# Patient Record
Sex: Male | Born: 1985 | Race: White | Hispanic: Yes | Marital: Single | Smoking: Current every day smoker
Health system: Southern US, Community
[De-identification: ages and names within clinical notes are randomized; demographics above are authoritative.]

## PROBLEM LIST (undated history)

## (undated) DIAGNOSIS — E119 Type 2 diabetes mellitus without complications: Secondary | ICD-10-CM

## (undated) DIAGNOSIS — F1010 Alcohol abuse, uncomplicated: Secondary | ICD-10-CM

---

## 2006-08-12 ENCOUNTER — Emergency Department (HOSPITAL_COMMUNITY): Admission: EM | Admit: 2006-08-12 | Discharge: 2006-08-12 | Payer: Self-pay | Admitting: Emergency Medicine

## 2018-11-02 ENCOUNTER — Emergency Department (HOSPITAL_COMMUNITY)
Admission: EM | Admit: 2018-11-02 | Discharge: 2018-11-02 | Disposition: A | Discharge status: Home / Self Care | Payer: Self-pay | Attending: Emergency Medicine | Admitting: Emergency Medicine

## 2018-11-02 ENCOUNTER — Encounter (HOSPITAL_COMMUNITY): Payer: Self-pay | Admitting: Emergency Medicine

## 2018-11-02 ENCOUNTER — Emergency Department (HOSPITAL_COMMUNITY): Payer: Self-pay

## 2018-11-02 ENCOUNTER — Other Ambulatory Visit: Payer: Self-pay

## 2018-11-02 DIAGNOSIS — R945 Abnormal results of liver function studies: Secondary | ICD-10-CM | POA: Insufficient documentation

## 2018-11-02 DIAGNOSIS — F10129 Alcohol abuse with intoxication, unspecified: Secondary | ICD-10-CM | POA: Insufficient documentation

## 2018-11-02 DIAGNOSIS — E119 Type 2 diabetes mellitus without complications: Secondary | ICD-10-CM | POA: Insufficient documentation

## 2018-11-02 DIAGNOSIS — R197 Diarrhea, unspecified: Secondary | ICD-10-CM | POA: Insufficient documentation

## 2018-11-02 DIAGNOSIS — R51 Headache: Secondary | ICD-10-CM | POA: Insufficient documentation

## 2018-11-02 DIAGNOSIS — R7989 Other specified abnormal findings of blood chemistry: Secondary | ICD-10-CM

## 2018-11-02 DIAGNOSIS — R634 Abnormal weight loss: Secondary | ICD-10-CM | POA: Insufficient documentation

## 2018-11-02 DIAGNOSIS — F1010 Alcohol abuse, uncomplicated: Secondary | ICD-10-CM

## 2018-11-02 DIAGNOSIS — Y907 Blood alcohol level of 200-239 mg/100 ml: Secondary | ICD-10-CM | POA: Insufficient documentation

## 2018-11-02 HISTORY — DX: Alcohol abuse, uncomplicated: F10.10

## 2018-11-02 LAB — CBC WITH DIFFERENTIAL/PLATELET
ABS IMMATURE GRANULOCYTES: 0.02 10*3/uL (ref 0.00–0.07)
BASOS ABS: 0.1 10*3/uL (ref 0.0–0.1)
BASOS PCT: 1 %
Eosinophils Absolute: 0 10*3/uL (ref 0.0–0.5)
Eosinophils Relative: 0 %
HCT: 47.2 % (ref 39.0–52.0)
Hemoglobin: 16 g/dL (ref 13.0–17.0)
Immature Granulocytes: 0 %
Lymphocytes Relative: 35 %
Lymphs Abs: 2.3 10*3/uL (ref 0.7–4.0)
MCH: 31.9 pg (ref 26.0–34.0)
MCHC: 33.9 g/dL (ref 30.0–36.0)
MCV: 94.2 fL (ref 80.0–100.0)
MONO ABS: 0.3 10*3/uL (ref 0.1–1.0)
Monocytes Relative: 4 %
NEUTROS ABS: 3.8 10*3/uL (ref 1.7–7.7)
NEUTROS PCT: 60 %
NRBC: 0 % (ref 0.0–0.2)
PLATELETS: 231 10*3/uL (ref 150–400)
RBC: 5.01 MIL/uL (ref 4.22–5.81)
RDW: 11.6 % (ref 11.5–15.5)
WBC: 6.5 10*3/uL (ref 4.0–10.5)

## 2018-11-02 LAB — COMPREHENSIVE METABOLIC PANEL
ALT: 199 U/L — AB (ref 0–44)
AST: 174 U/L — AB (ref 15–41)
Albumin: 4.3 g/dL (ref 3.5–5.0)
Alkaline Phosphatase: 109 U/L (ref 38–126)
Anion gap: 14 (ref 5–15)
CHLORIDE: 104 mmol/L (ref 98–111)
CO2: 19 mmol/L — ABNORMAL LOW (ref 22–32)
CREATININE: 0.5 mg/dL — AB (ref 0.61–1.24)
Calcium: 9.3 mg/dL (ref 8.9–10.3)
GFR calc Af Amer: >60 mL/min (ref >60)
GFR calc non Af Amer: >60 mL/min (ref >60)
GLUCOSE: 265 mg/dL — AB (ref 70–99)
POTASSIUM: 3.6 mmol/L (ref 3.5–5.1)
SODIUM: 137 mmol/L (ref 135–145)
Total Bilirubin: 0.5 mg/dL (ref 0.3–1.2)
Total Protein: 8.2 g/dL — ABNORMAL HIGH (ref 6.5–8.1)

## 2018-11-02 LAB — URINALYSIS, ROUTINE W REFLEX MICROSCOPIC
BACTERIA UA: NONE SEEN
Bilirubin Urine: NEGATIVE
Glucose, UA: >=500 mg/dL — AB
KETONES UR: 5 mg/dL — AB
LEUKOCYTE UA: NEGATIVE
NITRITE: NEGATIVE
PROTEIN: NEGATIVE mg/dL
Specific Gravity, Urine: 1.01 (ref 1.005–1.030)
pH: 6 (ref 5.0–8.0)

## 2018-11-02 LAB — RAPID URINE DRUG SCREEN, HOSP PERFORMED
AMPHETAMINES: NOT DETECTED
BARBITURATES: NOT DETECTED
BENZODIAZEPINES: NOT DETECTED
COCAINE: POSITIVE — AB
OPIATES: NOT DETECTED
Tetrahydrocannabinol: NOT DETECTED

## 2018-11-02 LAB — ACETAMINOPHEN LEVEL: Acetaminophen (Tylenol), Serum: <10 ug/mL — ABNORMAL LOW (ref 10–30)

## 2018-11-02 LAB — CBG MONITORING, ED: GLUCOSE-CAPILLARY: 265 mg/dL — AB (ref 70–99)

## 2018-11-02 LAB — ETHANOL: ALCOHOL ETHYL (B): 221 mg/dL — AB (ref <10)

## 2018-11-02 LAB — LIPASE, BLOOD: Lipase: 31 U/L (ref 11–51)

## 2018-11-02 LAB — SALICYLATE LEVEL

## 2018-11-02 MED ORDER — METFORMIN HCL 500 MG PO TABS
500.0000 mg | ORAL_TABLET | Freq: Once | ORAL | Status: AC
  Administered 2018-11-02: 500 mg via ORAL
  Filled 2018-11-02: qty 1

## 2018-11-02 MED ORDER — METFORMIN HCL 500 MG PO TABS: 500.0000 mg | ORAL_TABLET | Freq: Two times a day (BID) | ORAL | 0 refills | Status: AC

## 2018-11-02 MED ORDER — SODIUM CHLORIDE 0.9 % IV BOLUS
1000.0000 mL | Freq: Once | INTRAVENOUS | Status: AC
  Administered 2018-11-02: 1000 mL via INTRAVENOUS

## 2018-11-02 NOTE — ED Notes (Signed)
Patient transported to X-ray 

## 2018-11-02 NOTE — ED Triage Notes (Signed)
Using the interpreter patient reports headache and feeling thirsty x 2 days. Patient admits drinking 2 beers aprox 6-8 hours ago. repeatedly asking patient if he drinks alcohol daily, but will not answer.

## 2018-11-02 NOTE — ED Notes (Signed)
Patient informed MD that he drank 12 beers, not 2 throughout the night.

## 2018-11-02 NOTE — Discharge Instructions (Signed)
Llame a los dos nmeros de telfono enumerados para programar una cita con el mdico de atencin primaria que puede verlo primero. Regrese al servicio de urgencias con cualquier sntoma nuevo o que empeore. Su consumo de alcohol est causando dao a su hgado. Debe dejar de beber y que su mdico repita sus anlisis de hgado una vez que deje de [facility].

## 2018-11-02 NOTE — ED Provider Notes (Signed)
Emergency Department Provider Note   I have reviewed the triage vital signs and the nursing notes.   HISTORY  Chief Complaint Headache; Dry Mouth; and Weight Loss  HPI, ROS, and exam performed with video Spanish interpreter  HPI Jacob Duarte is a 33 y.o. male with no known past medical history presents to the emergency department with increased thirst, dry mouth, unintentional weight loss, fatigue, diarrhea.  Patient states that he has been drinking almost daily for the past 3 months to deal with his dry mouth.  He states when he drinks water he feels like he needs to drink more and more but if he drinks beer this seems to quench his thirst.  He estimates drinking approximately 12 beers last night but states that he typically drinks 3 daily.  Denies any history of alcohol abuse.  He denies using other drugs.  He denies any depression or intention to harm himself.  He denies taking any pills.  He does not take prescription medications.  He has not had vomiting or chest pain.  Patient came in today because he felt like his symptoms are getting worse and not better.  He does not have a primary care physician.    Past Medical History:  Diagnosis Date  . Alcohol abuse     There are no active problems to display for this patient.  Allergies Patient has no known allergies.  No family history on file.  Social History Social History   Tobacco Use  . Smoking status: Never Smoker  . Smokeless tobacco: Never Used  Substance Use Topics  . Alcohol use: Yes  . Drug use: Not Currently    Review of Systems  Constitutional: No fever/chills. Positive fatigue.  Eyes: No visual changes. ENT: No sore throat. Positive dry mouth and polydipsia.  Cardiovascular: Denies chest pain. Respiratory: Denies shortness of breath. Gastrointestinal: No abdominal pain.  No nausea, no vomiting. Positive diarrhea.  No constipation. Genitourinary: Negative for dysuria. Musculoskeletal: Negative  for back pain. Skin: Negative for rash. Neurological: Negative for headaches, focal weakness or numbness.  10-point ROS otherwise negative.  ____________________________________________   PHYSICAL EXAM:  VITAL SIGNS: ED Triage Vitals  Enc Vitals Group     BP 11/02/18 0732 136/89     Pulse Rate 11/02/18 0732 (!) 109     Resp 11/02/18 0732 18     Temp 11/02/18 0741 98.5 F (36.9 C)     Temp Source 11/02/18 0741 Oral     SpO2 11/02/18 0732 97 %   Constitutional: Alert and oriented. Well appearing and in no acute distress. Eyes: Conjunctivae are normal.  Head: Atraumatic. Nose: No congestion/rhinnorhea. Mouth/Throat: Mucous membranes are dry. No ulcerations or other lesions.  Neck: No stridor.   Cardiovascular: Tachycardia. Good peripheral circulation. Grossly normal heart sounds.   Respiratory: Normal respiratory effort.  No retractions. Lungs CTAB. Gastrointestinal: Soft and nontender. No distention.  Musculoskeletal: No lower extremity tenderness nor edema. No gross deformities of extremities. Neurologic:  Normal speech and language. No gross focal neurologic deficits are appreciated.  Skin:  Skin is warm, dry and intact. No rash noted.   ____________________________________________   LABS (all labs ordered are listed, but only abnormal results are displayed)  Labs Reviewed  COMPREHENSIVE METABOLIC PANEL - Abnormal; Notable for the following components:      Result Value   CO2 19 (*)    Glucose, Bld 265 (*)    BUN <5 (*)    Creatinine, Ser 0.50 (*)  Total Protein 8.2 (*)    AST 174 (*)    ALT 199 (*)    All other components within normal limits  ETHANOL - Abnormal; Notable for the following components:   Alcohol, Ethyl (B) 221 (*)    All other components within normal limits  ACETAMINOPHEN LEVEL - Abnormal; Notable for the following components:   Acetaminophen (Tylenol), Serum <10 (*)    All other components within normal limits  CBG MONITORING, ED -  Abnormal; Notable for the following components:   Glucose-Capillary 265 (*)    All other components within normal limits  LIPASE, BLOOD  SALICYLATE LEVEL  CBC WITH DIFFERENTIAL/PLATELET  URINALYSIS, ROUTINE W REFLEX MICROSCOPIC  RAPID URINE DRUG SCREEN, HOSP PERFORMED   ____________________________________________  EKG   EKG Interpretation  Date/Time:  Saturday November 02 2018 08:05:57 EST Ventricular Rate:  103 PR Interval:    QRS Duration: 96 QT Interval:  341 QTC Calculation: 447 R Axis:   6 Text Interpretation:  Sinus tachycardia No STEMI.  Confirmed by Alona Bene [phone]) on 11/02/2018 8:14:02 AM       ____________________________________________  RADIOLOGY  Dg Chest 2 View  Result Date: 11/02/2018 CLINICAL DATA:  Headache and dehydration.  No chest complaints. EXAM: CHEST - 2 VIEW COMPARISON:  Chest x-ray dated August 12, 2006. FINDINGS: The heart size and mediastinal contours are within normal limits. Both lungs are clear. The visualized skeletal structures are unremarkable. IMPRESSION: No active cardiopulmonary disease. Electronically Signed   By: Obie Dredge M.D.   On: 11/02/2018 08:43    ____________________________________________   PROCEDURES  Procedure(s) performed:   Procedures  None  ____________________________________________   INITIAL IMPRESSION / ASSESSMENT AND PLAN / ED COURSE  Pertinent labs & imaging results that were available during my care of the patient were reviewed by me and considered in my medical decision making (see chart for details).  Patient presents to the emergency department for evaluation of dry mouth, polydipsia, diarrhea, generalized weakness.  He drinks alcohol daily but states he just started doing that over the last 3 months because of the polydipsia.  Initial triage note reports drinking and taking an unknown amount of pills.  Patient denies this and family have no concerns regarding this.  Unclear how this became  the patient's chief complaint but I do plan on obtaining screening blood work for possible new onset diabetes.  Given the initial arrival complaint I will also obtain a Tylenol, salicylate, UDS.   Lab work reviewed which shows new onset diabetes.  No DKA.  Patient's symptoms improved with IV fluids.  Starting metformin here and discharged with a prescription for the next 30 days.  I discussed diabetes with the patient including diet modifications.  Also discussed that his alcohol abuse is likely affecting his liver in a negative way.  I discussed his abnormal liver labs.  No abdominal pain, tenderness, bilirubin elevation to require ultrasound or other abdominal imaging at this time.  Provided multiple contacts for PCPs in the area and discussed ED return precautions.  All this was done with the video Spanish interpreter and family at bedside.  Patient does have elevated ethanol level here but family is sober and will drive him home.  ____________________________________________  FINAL CLINICAL IMPRESSION(S) / ED DIAGNOSES  Final diagnoses:  Diabetes mellitus, new onset (HCC)  Alcohol abuse  Elevated LFTs     MEDICATIONS GIVEN DURING THIS VISIT:  Medications  metFORMIN (GLUCOPHAGE) tablet 500 mg (has no administration in time range)  sodium  chloride 0.9 % bolus 1,000 mL (0 mLs Intravenous Stopped 11/02/18 0930)     NEW OUTPATIENT MEDICATIONS STARTED DURING THIS VISIT:  New Prescriptions   METFORMIN (GLUCOPHAGE) 500 MG TABLET    Take 1 tablet (500 mg total) by mouth 2 (two) times daily with a meal for 30 days.    Note:  This document was prepared using Dragon voice recognition software and may include unintentional dictation errors.  Alona Bene, MD Emergency Medicine    Tiwanda Threats, Arlyss Repress, MD 11/02/18 1009

## 2018-12-19 ENCOUNTER — Emergency Department (HOSPITAL_COMMUNITY): Payer: Self-pay

## 2018-12-19 ENCOUNTER — Emergency Department (HOSPITAL_COMMUNITY)
Admission: EM | Admit: 2018-12-19 | Discharge: 2018-12-19 | Disposition: A | Discharge status: Home / Self Care | Payer: Self-pay | Attending: Emergency Medicine | Admitting: Emergency Medicine

## 2018-12-19 ENCOUNTER — Encounter (HOSPITAL_COMMUNITY): Payer: Self-pay | Admitting: Emergency Medicine

## 2018-12-19 ENCOUNTER — Other Ambulatory Visit: Payer: Self-pay

## 2018-12-19 DIAGNOSIS — F17210 Nicotine dependence, cigarettes, uncomplicated: Secondary | ICD-10-CM | POA: Insufficient documentation

## 2018-12-19 DIAGNOSIS — W19XXXA Unspecified fall, initial encounter: Secondary | ICD-10-CM

## 2018-12-19 DIAGNOSIS — S41112A Laceration without foreign body of left upper arm, initial encounter: Secondary | ICD-10-CM | POA: Insufficient documentation

## 2018-12-19 DIAGNOSIS — S51812A Laceration without foreign body of left forearm, initial encounter: Secondary | ICD-10-CM | POA: Insufficient documentation

## 2018-12-19 DIAGNOSIS — W16112A Fall into natural body of water striking water surface causing other injury, initial encounter: Secondary | ICD-10-CM | POA: Insufficient documentation

## 2018-12-19 DIAGNOSIS — Z79899 Other long term (current) drug therapy: Secondary | ICD-10-CM | POA: Insufficient documentation

## 2018-12-19 DIAGNOSIS — Z23 Encounter for immunization: Secondary | ICD-10-CM | POA: Insufficient documentation

## 2018-12-19 DIAGNOSIS — F1010 Alcohol abuse, uncomplicated: Secondary | ICD-10-CM | POA: Insufficient documentation

## 2018-12-19 DIAGNOSIS — W540XXA Bitten by dog, initial encounter: Secondary | ICD-10-CM | POA: Insufficient documentation

## 2018-12-19 DIAGNOSIS — Y9389 Activity, other specified: Secondary | ICD-10-CM | POA: Insufficient documentation

## 2018-12-19 DIAGNOSIS — Y929 Unspecified place or not applicable: Secondary | ICD-10-CM | POA: Insufficient documentation

## 2018-12-19 DIAGNOSIS — Y998 Other external cause status: Secondary | ICD-10-CM | POA: Insufficient documentation

## 2018-12-19 DIAGNOSIS — E119 Type 2 diabetes mellitus without complications: Secondary | ICD-10-CM | POA: Insufficient documentation

## 2018-12-19 HISTORY — DX: Type 2 diabetes mellitus without complications: E11.9

## 2018-12-19 LAB — RAPID URINE DRUG SCREEN, HOSP PERFORMED
Amphetamines: NOT DETECTED
Barbiturates: NOT DETECTED
Benzodiazepines: NOT DETECTED
Cocaine: NOT DETECTED
Opiates: NOT DETECTED
Tetrahydrocannabinol: NOT DETECTED

## 2018-12-19 LAB — CBG MONITORING, ED: Glucose-Capillary: 142 mg/dL — ABNORMAL HIGH (ref 70–99)

## 2018-12-19 LAB — RAPID HIV SCREEN (HIV 1/2 AB+AG)
HIV 1/2 Antibodies: NONREACTIVE
HIV-1 P24 Antigen - HIV24: NONREACTIVE

## 2018-12-19 MED ORDER — TETANUS-DIPHTH-ACELL PERTUSSIS 5-2.5-18.5 LF-MCG/0.5 IM SUSP
0.5000 mL | Freq: Once | INTRAMUSCULAR | Status: AC
  Administered 2018-12-19: 0.5 mL via INTRAMUSCULAR
  Filled 2018-12-19: qty 0.5

## 2018-12-19 MED ORDER — SODIUM CHLORIDE 0.9 % IV SOLN
3.0000 g | Freq: Once | INTRAVENOUS | Status: AC
  Administered 2018-12-19: 3 g via INTRAVENOUS
  Filled 2018-12-19: qty 3

## 2018-12-19 MED ORDER — SODIUM CHLORIDE 0.9 % IV BOLUS
1000.0000 mL | Freq: Once | INTRAVENOUS | Status: AC
  Administered 2018-12-19: 1000 mL via INTRAVENOUS

## 2018-12-19 MED ORDER — AMOXICILLIN-POT CLAVULANATE 875-125 MG PO TABS: 1.0000 | ORAL_TABLET | Freq: Two times a day (BID) | ORAL | 0 refills | Status: AC

## 2018-12-19 MED ORDER — LIDOCAINE-EPINEPHRINE (PF) 2 %-1:200000 IJ SOLN
20.0000 mL | Freq: Once | INTRAMUSCULAR | Status: AC
  Administered 2018-12-19: 20 mL via INTRADERMAL
  Filled 2018-12-19: qty 20

## 2018-12-19 NOTE — ED Notes (Signed)
Patient transported to CT 

## 2018-12-19 NOTE — ED Notes (Signed)
Provider at bedside
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
## 2018-12-19 NOTE — ED Notes (Signed)
Pt states his birthday is 06-03-1986 not November 03, 1985.

## 2018-12-19 NOTE — ED Triage Notes (Signed)
[facility] transported pt to [facility] ED and reports the following:  --Pt speaks spanish --Dog bites Left upper arm - 2-6 bites anterior and posterior to adipose tissue. still bleeding Left upper chest - bites Right calf - puncture, bites  --ETOH on board, amount unknown --AO x4 --BP 118/81 --HR 98 --O2 98%  --100 mcg fentyl. Last dose 23:50 --R AC 20G

## 2018-12-19 NOTE — ED Notes (Signed)
Using translator service, patient consented to blood analysis and urine analysis.

## 2018-12-19 NOTE — ED Notes (Signed)
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

## 2018-12-19 NOTE — ED Notes (Addendum)
Wounds irrigated and cleaned with normal saline, dressed with clean gauze. Two open areas with fatty tissue exposed, one on medial bite wound and one lateral by left elbow.

## 2018-12-19 NOTE — Discharge Instructions (Addendum)
Take antibiotics as prescribed and complete the full course. Wound recheck with your doctor in 2 days, referral given for [place] and wellness if needed.  He will need your staples removed in 10 days. Return to ER for concerns for infection or worsening symptoms. Take Motrin if needed for pain.  Avoid Tylenol due to history of high liver enzymes.  Tome antibiticos segn lo prescrito y complete el curso completo. Vuelva a consultar la herida con su mdico en 2 das, referencia dada para la salud y Counsellor del cono si es necesario.  Necesitar que le quiten las grapas en [address]. Regrese a Urgencias para obtener inquietudes por infeccin o empeoramiento de los sntomas. Tome Motrin si es necesario para Chief Technology Officer.  Evitar Tylenol debido a la historia de enzimas hepticas altas.

## 2018-12-19 NOTE — ED Notes (Signed)
Translator used for interpretation to ensure comprehension. Patient verbalized understanding of discharge instructions and agreed to follow-up care. Patient's brother called and he is on his way to pick up the patient.

## 2018-12-20 LAB — HEPATITIS B SURFACE ANTIGEN: Hepatitis B Surface Ag: NEGATIVE

## 2018-12-20 LAB — HEPATITIS C ANTIBODY: HCV Ab: <0.1 s/co ratio (ref 0.0–0.9)

## 2019-01-07 ENCOUNTER — Inpatient Hospital Stay: Payer: Self-pay | Admitting: Internal Medicine
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
